# Patient Record
Sex: Female | Born: 1975 | Race: White | Hispanic: No | Marital: Married | Smoking: Current every day smoker
Health system: Southern US, Community
[De-identification: ages and names within clinical notes are randomized; demographics above are authoritative.]

---

## 2013-11-30 ENCOUNTER — Other Ambulatory Visit (HOSPITAL_COMMUNITY): Payer: Self-pay | Admitting: *Deleted

## 2013-11-30 ENCOUNTER — Encounter (HOSPITAL_COMMUNITY): Payer: Self-pay | Admitting: *Deleted

## 2013-11-30 DIAGNOSIS — O09523 Supervision of elderly multigravida, third trimester: Secondary | ICD-10-CM

## 2013-11-30 DIAGNOSIS — O283 Abnormal ultrasonic finding on antenatal screening of mother: Secondary | ICD-10-CM

## 2013-11-30 DIAGNOSIS — Z3689 Encounter for other specified antenatal screening: Secondary | ICD-10-CM

## 2013-12-09 ENCOUNTER — Ambulatory Visit (HOSPITAL_COMMUNITY)
Admission: RE | Admit: 2013-12-09 | Discharge: 2013-12-09 | Disposition: A | Discharge status: Home / Self Care | Payer: No Typology Code available for payment source | Source: Ambulatory Visit | Attending: *Deleted | Admitting: *Deleted

## 2013-12-09 ENCOUNTER — Encounter (HOSPITAL_COMMUNITY): Payer: Self-pay

## 2013-12-09 VITALS — BP 120/76 | HR 86 | Wt 156.0 lb

## 2013-12-09 DIAGNOSIS — Z36 Encounter for antenatal screening of mother: Secondary | ICD-10-CM | POA: Diagnosis not present

## 2013-12-09 DIAGNOSIS — Z3689 Encounter for other specified antenatal screening: Secondary | ICD-10-CM

## 2013-12-09 DIAGNOSIS — O09522 Supervision of elderly multigravida, second trimester: Secondary | ICD-10-CM | POA: Diagnosis present

## 2013-12-09 DIAGNOSIS — O09523 Supervision of elderly multigravida, third trimester: Secondary | ICD-10-CM

## 2013-12-09 DIAGNOSIS — O09513 Supervision of elderly primigravida, third trimester: Secondary | ICD-10-CM | POA: Insufficient documentation

## 2013-12-09 DIAGNOSIS — Z3A26 26 weeks gestation of pregnancy: Secondary | ICD-10-CM | POA: Diagnosis not present

## 2013-12-09 DIAGNOSIS — O283 Abnormal ultrasonic finding on antenatal screening of mother: Secondary | ICD-10-CM

## 2013-12-09 NOTE — Progress Notes (Signed)
Genetic Counseling  High-Risk Gestation Note  Appointment Date:  12/09/2013 Referred By: Belva CromeEnsminger, Jason, MD Date of Birth:  07/03/1975   Pregnancy History: [id number] Estimated Date of Delivery: 03/11/14 Estimated Gestational Age: 1073w6d Attending: Particia Nearingecker, Martha, MD   Mrs. Sharren BridgeShannon L Wrenn was seen for genetic counseling regarding a maternal age of 38 and the ultrasound finding of an isolated echogenic intracardiac focus.  She was counseled regarding maternal age and the association with risk for chromosome conditions due to nondisjunction with aging of the ova.   We reviewed chromosomes, nondisjunction, and the associated 1 in 280 risk for fetal aneuploidy related to a maternal age of 38 at 6273w6d gestation.  She was counseled that the risk for aneuploidy decreases as gestational age increases, accounting for those pregnancies which spontaneously abort.  We specifically discussed Down syndrome (trisomy 2921), trisomies 5413 and 6218, and sex chromosome aneuploidies (47,XXX and 47,XXY) including the common features and prognoses of each.   We also reviewed Mrs. Bermea's noninvasive prenatal screening (NIPS)/cell free DNA (cfDNA) testing result. Specifically, Mrs. Marcene CorningRigney had InformaSeq testing performed at her obstetrician's office. We reviewed that the results from this testing are negative, "no aneuploidy detected." We reviewed that NIPS analyzes specific placental (fetal) DNA in maternal circulation. NIPS is considered to be highly specific and sensitive, but is not considered to be a diagnostic test. We reviewed that this testing identifies the majority of pregnancies with trisomies 21, 5713, and 18, as well as specific sex chromosome conditions including: 47,XXX and 47,XXY.   We reviewed other available options including detailed ultrasound and amniocentesis. We reviewed the approximate 1 in 300-500 risk for complications for amniocentesis, including spontaneous pregnancy loss. Mrs. Marcene CorningRigney understands that  given the normal NIPS (InformaSeq) result, the risk of fetal aneuploidy is much lower than the risk of a complication related to diagnostic invasive testing. After consideration of these options, she elected to have a detailed ultrasound, but declined amniocentesis. A complete ultrasound was performed today. The ultrasound report will be documented separately. There were no visualized fetal anomalies. The fetus had an isolated echogenic intracardiac focus (EIF). We discussed that an EIF is generally believed to be a normal variant without any concerns for the pregnancy. It is commonly believed to result from a calcification. However, the presence of an EIF can slightly increase the chance for Down syndrome in a pregnancy. Considering the normal InformaSeq result, we discussed that this finding is likely a benign variant. She understands that screening tests cannot rule out all birth defects or genetic syndromes. The patient was advised of this limitation and states she still does not want additional testing at this time.  Mrs. Marcene CorningRigney was provided with written information regarding cystic fibrosis (CF) including the carrier frequency and incidence in the Caucasian population, the availability of carrier testing and prenatal diagnosis if indicated.  In addition, we discussed that CF is routinely screened for as part of the [facility] newborn screening panel.  Mrs. Marcene CorningRigney elected to have CF carrier testing previously.  We reviewed the normal results and the associated reduction in carrier risks.     Both family histories were reviewed and found to be contributory for Mrs. Austad having a maternal half brother who had an apparently isolated cleft lip.  We discussed that cleft lip with or without cleft palate can be syndromic or isolated.  If the patient's relative has a syndromic form of clefting, the chance of having an affected child depends on the inheritance pattern of that condition.  If the patient's relative has an  isolated form of clefting, we discussed the probable multifactorial inheritance and explained that genetic testing for isolated cleft lip with or without cleft palate is not currently available.  Based on the family history, this couple's chance to have a baby with an isolated cleft lip with or without cleft palate is <1%.  We reviewed that there was no evidence of orofacial clefting by detailed ultrasound today.    In addition, Mrs. Marcene CorningRigney reported that her mother, a maternal half brother, a maternal uncle, a maternal first cousin, and a maternal first cousin once-removed have epilepsy.  They have no other health concerns, or intellectual disability.  Epilepsy occurs in 1% of the population and can have many causes.  Approximately 80% of epilepsy is thought to be idiopathic while the remaining 20% is secondary to a variety of factors such as perinatal events, infections, trauma and genetic disease.  A specific diagnosis in an affected individual is necessary to accurately assess the risk for other family members to develop epilepsy.  In the absence of a known etiology, epilepsy is thought to be caused by a combination of genetic and environmental factors, called multifactorial inheritance. Recurrence risk for offspring of an individual with idiopathic epilepsy is estimated to be 4%. However, given the strong family history, we discussed the risk for a dominant condition, with reduced penetrance.  We reviewed that the risk of recurrence could be as high as 50%.   The patient understands that prenatal screening and testing is not available for epilepsy in the majority of cases. We discussed that it may be helpful for Mrs. Russon to notify her child's pediatrician of this history so that the child can be screened and followed appropriately.  The remainder of both family histories were noncontributory for birth defects, mental retardation, and known genetic conditions. Without further information regarding the  provided family history, an accurate genetic risk cannot be calculated. Further genetic counseling is warranted if more information is obtained.  Mrs. Marcene CorningRigney denied exposure to environmental toxins or chemical agents. She denied the use of alcohol and street drugs. She reported that she smokes <1/2 ppd.  We discussed the implications and counseled regarding cessation.  She denied significant viral illnesses during the course of her pregnancy.   I counseled Mrs. Thibeaux regarding the above risks and available options.  The approximate face-to-face time with the genetic counselor was 42 minutes.  Despina Ariashristy Lucion Dilger, MS Certified Genetic Counselor

## 2013-12-29 ENCOUNTER — Encounter (HOSPITAL_COMMUNITY): Payer: Self-pay

## 2014-01-05 ENCOUNTER — Other Ambulatory Visit (HOSPITAL_COMMUNITY): Payer: Self-pay

## 2014-10-14 ENCOUNTER — Encounter (HOSPITAL_COMMUNITY): Payer: Self-pay | Admitting: *Deleted

## 2014-12-06 IMAGING — US US OB DETAIL+14 WK
1 series · 12 of 28 positions shown · non-contrast
Comparison: none

[Series 1: us ob detail+14 wk · 0.11mm/px · 12 of 84 slices shown]
[im 4/84]
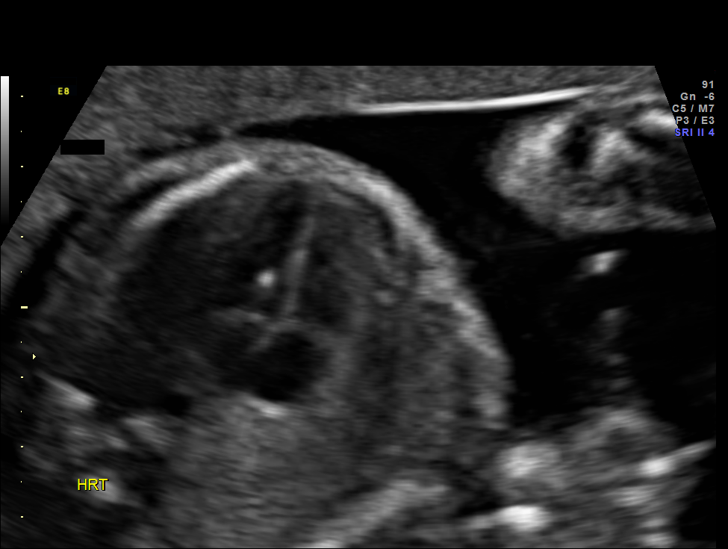
[im 10/84]
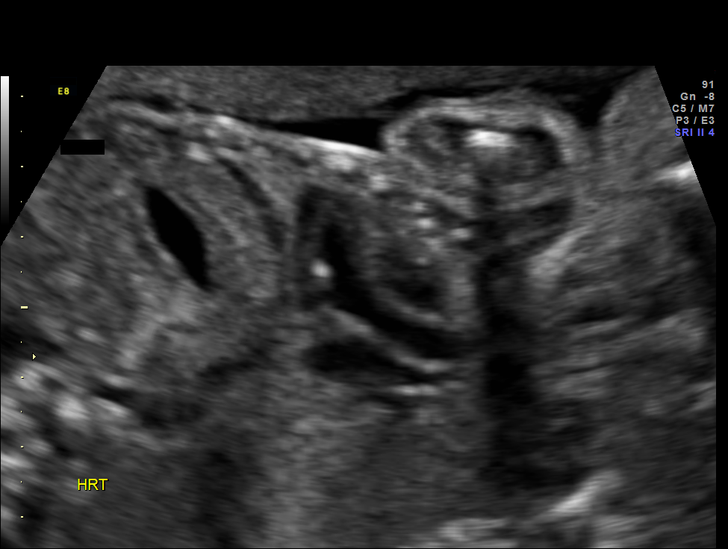
[im 16/84]
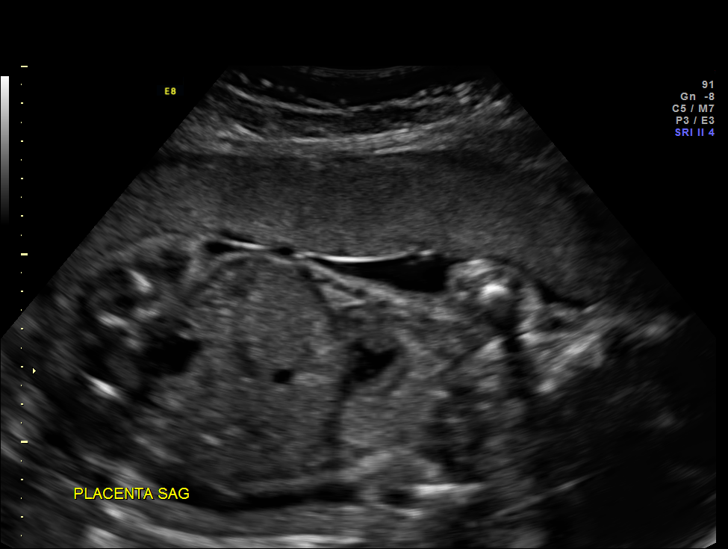
[im 25/84]
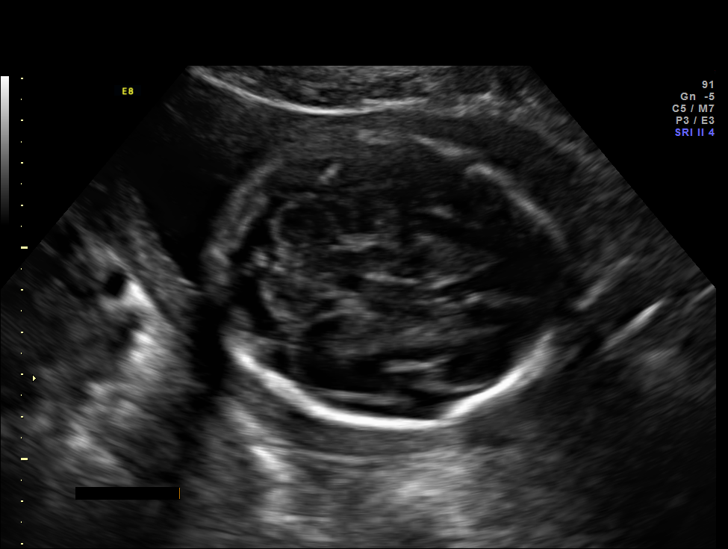
[im 31/84]
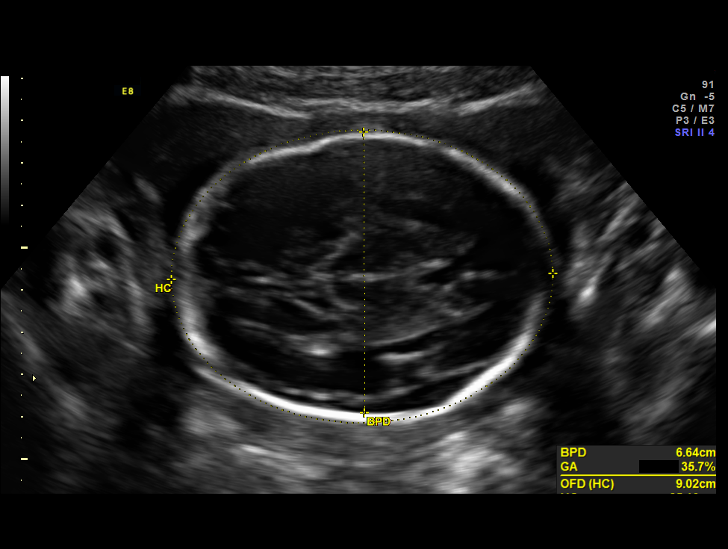
[im 37/84]
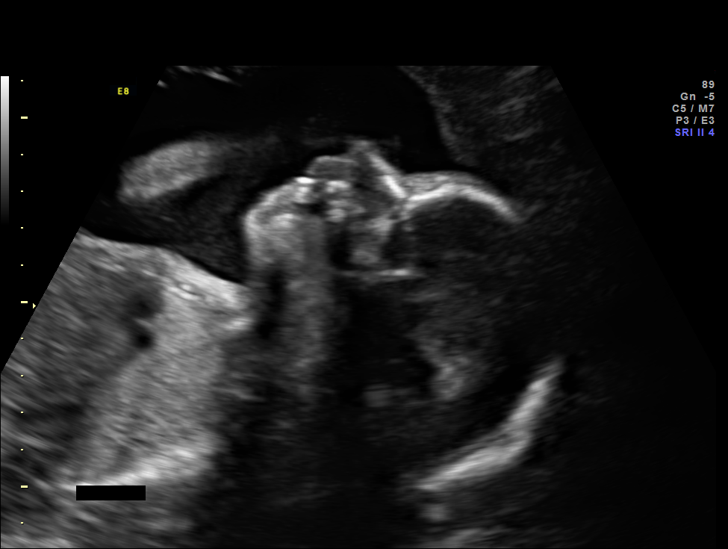
[im 47/84]
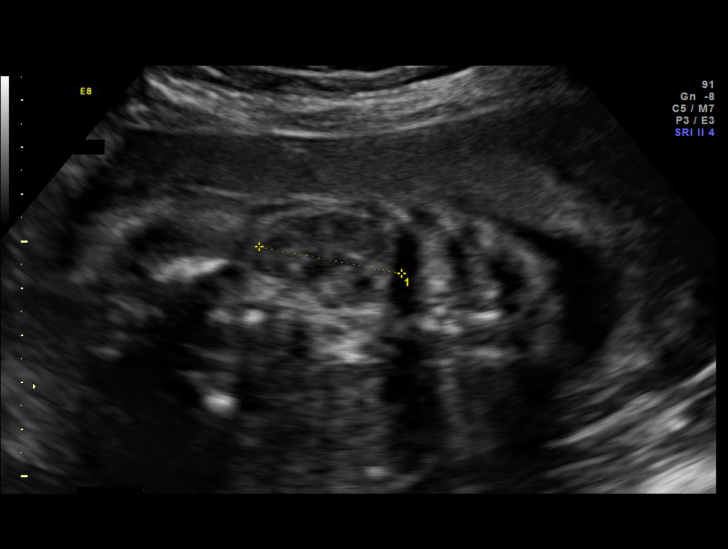
[im 53/84]
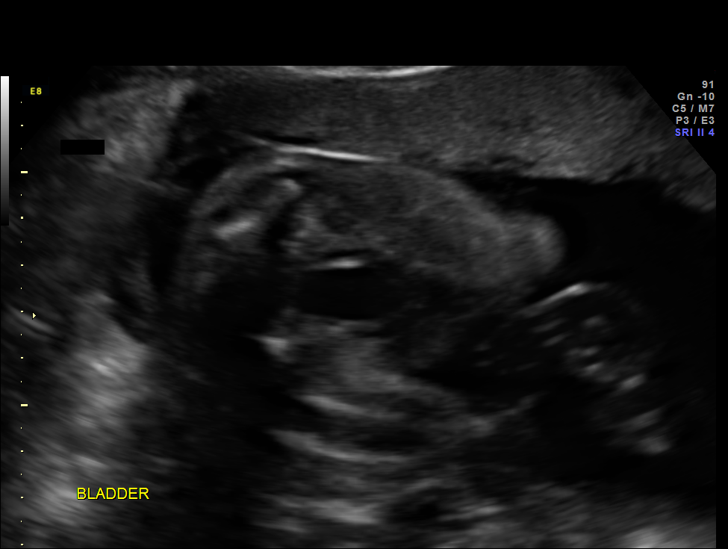
[im 59/84]
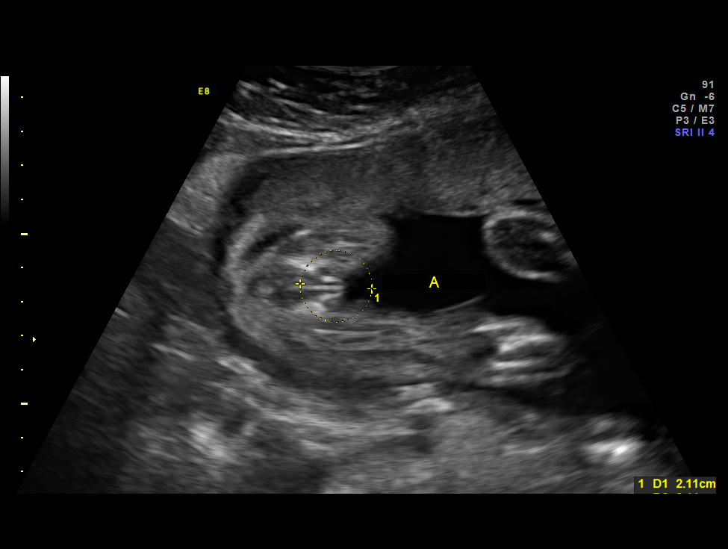
[im 68/84]
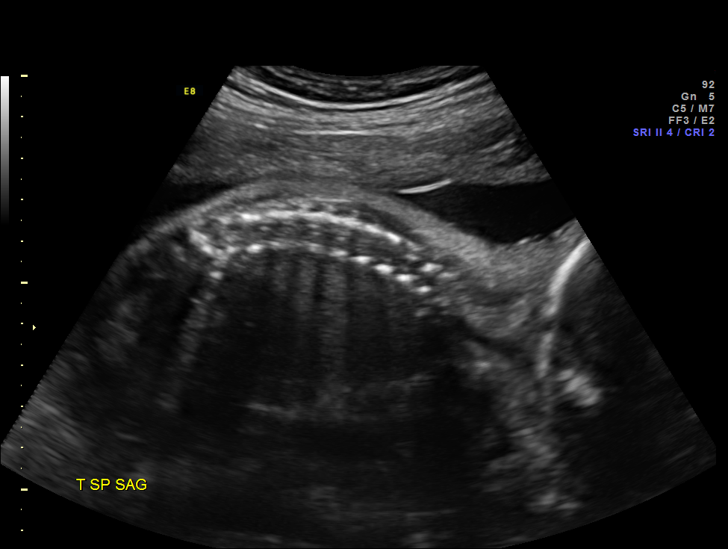
[im 74/84]
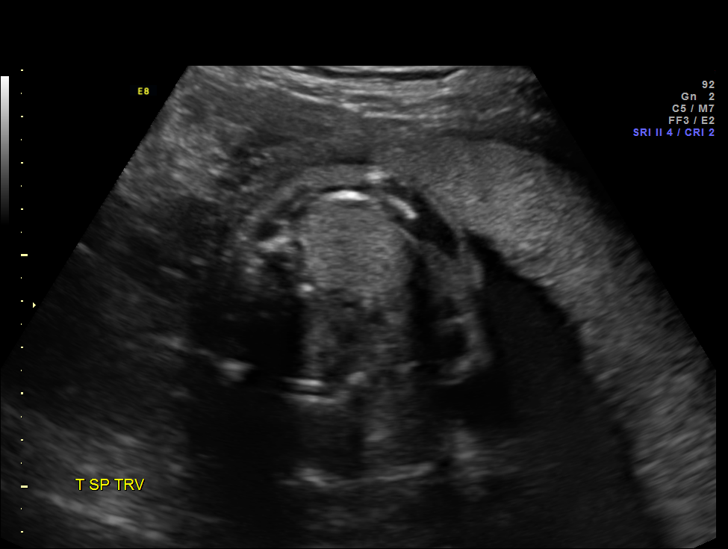
[im 80/84]
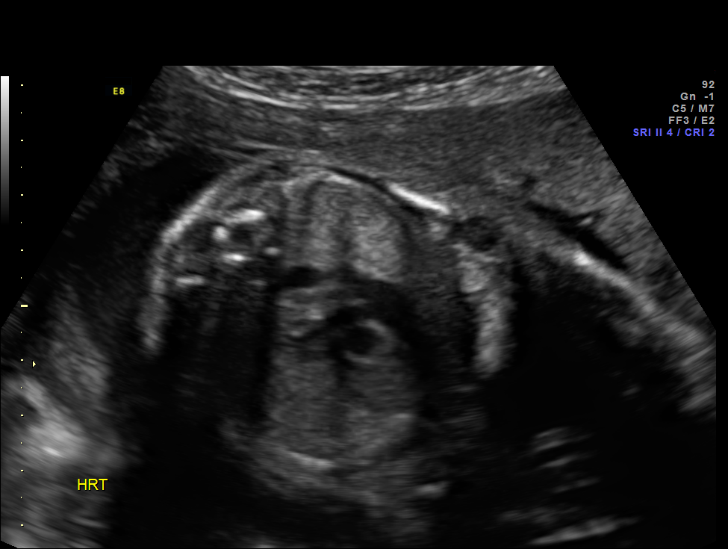

[12 of 28 positions shown; findings below may reference images not displayed]

OBSTETRICS REPORT
                      (Signed Final 12/11/2013 [DATE])

Service(s) Provided

 US OB DETAIL + 14 WK                                  76811.0
Indications

 Detailed fetal anatomic survey                        Z36
 Advanced maternal age primigravida 36+, second
 trimester - low risk NIPS
 Echogenic intracardiac focus of the heart  (EIF)
 Cigarette smoker
 26 weeks gestation of pregnancy
Fetal Evaluation

 Num Of Fetuses:    1
 Fetal Heart Rate:  165                          bpm
 Cardiac Activity:  Observed
 Presentation:      Cephalic
 Placenta:          Anterior, above cervical os
 P. Cord            Visualized, central
 Insertion:

 Amniotic Fluid
 AFI FV:      Subjectively within normal limits
                                             Larg Pckt:     4.9  cm
Biometry

 BPD:       66  mm     G. Age:  26w 4d                CI:         72.8   70 - 86
 OFD:     90.7  mm                                    FL/HC:      19.0   18.6 -

 HC:     251.3  mm     G. Age:  27w 2d       37  %    HC/AC:      1.07   1.05 -

 AC:     234.9  mm     G. Age:  27w 6d       71  %    FL/BPD:     72.4   71 - 87
 FL:      47.8  mm     G. Age:  26w 0d       15  %    FL/AC:      20.3   20 - 24
 HUM:     43.4  mm     G. Age:  25w 6d       24  %
 CER:     30.4  mm     G. Age:  26w 5d       46  %

 Est. FW:    9699  gm      2 lb 4 oz     58  %
Gestational Age

 LMP:           26w 6d        Date:  06/04/13                 EDD:   03/11/14
 U/S Today:     27w 0d                                        EDD:   03/10/14
 Best:          26w 6d     Det. By:  LMP  (06/04/13)          EDD:   03/11/14
Anatomy

 Cranium:          Appears normal         Aortic Arch:      Appears normal
 Fetal Cavum:      Appears normal         Ductal Arch:      Appears normal
 Ventricles:       Appears normal         Diaphragm:        Appears normal
 Choroid Plexus:   Appears normal         Stomach:          Appears normal, left
                                                            sided
 Cerebellum:       Appears normal         Abdomen:          Appears normal
 Posterior Fossa:  Appears normal         Abdominal Wall:   Appears nml (cord
                                                            insert, abd wall)
 Nuchal Fold:      Not applicable (>20    Cord Vessels:     Appears normal (3
                   wks GA)                                  vessel cord)
 Face:             Appears normal         Kidneys:          Appear normal
                   (orbits and profile)
 Lips:             Appears normal         Bladder:          Appears normal
 Heart:            Appears normal         Spine:            Appears normal
                   (4CH, EIF)
 RVOT:             Appears normal         Lower             Appears normal
                                          Extremities:
 LVOT:             Appears normal         Upper             Appears normal
                                          Extremities:

 Other:  Fetus appears to be a female. Heels and 5th digit visualized. Nasal
         bone visualized.
Targeted Anatomy

 Fetal Central Nervous System
 Cisterna Magna:
Cervix Uterus Adnexa

 Cervical Length:    3.4      cm

 Cervix:       Normal appearance by transabdominal scan.

 Adnexa:     No abnormality visualized.
Comments

 An echogenic focus was seen in the left cardiac ventricle.
 This is felt to represent a calcified papillary muscle and is not
 associated with structural or functional cardiac abnormalities.
 Although an echogenic cardiac focus may be associated with
 an increased risk of Down syndrome, this risk is felt to be
 minimal, especially when it is seen as an isolated finding.
Impression

 SIUP at 26+6 weeks
 Normal detailed fetal anatomy
 Normal amniotic fluid volume
 Measurements consistent with LMP dating; EFW at the 58th
 %tile
Recommendations

 Follow-up as clinically indicated
